# Patient Record
Sex: Male | Born: 1965 | Race: White | Hispanic: No | Marital: Single | Smoking: Never smoker
Health system: Southern US, Community
[De-identification: ages and names within clinical notes are randomized; demographics above are authoritative.]

## PROBLEM LIST (undated history)

## (undated) DIAGNOSIS — M92529 Juvenile osteochondrosis of tibia tubercle, unspecified leg: Secondary | ICD-10-CM

## (undated) DIAGNOSIS — F329 Major depressive disorder, single episode, unspecified: Secondary | ICD-10-CM

## (undated) DIAGNOSIS — T7840XA Allergy, unspecified, initial encounter: Secondary | ICD-10-CM

## (undated) DIAGNOSIS — F419 Anxiety disorder, unspecified: Secondary | ICD-10-CM

## (undated) DIAGNOSIS — M9250 Juvenile osteochondrosis of tibia and fibula, unspecified leg: Secondary | ICD-10-CM

## (undated) DIAGNOSIS — F32A Depression, unspecified: Secondary | ICD-10-CM

## (undated) DIAGNOSIS — N200 Calculus of kidney: Secondary | ICD-10-CM

## (undated) DIAGNOSIS — E785 Hyperlipidemia, unspecified: Secondary | ICD-10-CM

## (undated) HISTORY — DX: Depression, unspecified: F32.A

## (undated) HISTORY — DX: Juvenile osteochondrosis of tibia and fibula, unspecified leg: M92.50

## (undated) HISTORY — DX: Major depressive disorder, single episode, unspecified: F32.9

## (undated) HISTORY — DX: Allergy, unspecified, initial encounter: T78.40XA

## (undated) HISTORY — DX: Calculus of kidney: N20.0

## (undated) HISTORY — DX: Anxiety disorder, unspecified: F41.9

## (undated) HISTORY — DX: Hyperlipidemia, unspecified: E78.5

## (undated) HISTORY — DX: Juvenile osteochondrosis of tibia tubercle, unspecified leg: M92.529

---

## 2007-12-17 ENCOUNTER — Ambulatory Visit: Payer: Self-pay | Admitting: *Deleted

## 2007-12-26 ENCOUNTER — Ambulatory Visit: Payer: Self-pay | Admitting: *Deleted

## 2008-01-02 ENCOUNTER — Ambulatory Visit: Payer: Self-pay | Admitting: *Deleted

## 2008-01-14 ENCOUNTER — Ambulatory Visit: Payer: Self-pay | Admitting: *Deleted

## 2008-01-21 ENCOUNTER — Ambulatory Visit: Payer: Self-pay | Admitting: *Deleted

## 2008-01-28 ENCOUNTER — Ambulatory Visit: Payer: Self-pay | Admitting: *Deleted

## 2008-02-04 ENCOUNTER — Ambulatory Visit: Payer: Self-pay | Admitting: *Deleted

## 2008-02-11 ENCOUNTER — Ambulatory Visit: Payer: Self-pay | Admitting: *Deleted

## 2008-02-18 ENCOUNTER — Ambulatory Visit: Payer: Self-pay | Admitting: *Deleted

## 2008-02-25 ENCOUNTER — Ambulatory Visit: Payer: Self-pay | Admitting: *Deleted

## 2008-03-03 ENCOUNTER — Ambulatory Visit: Payer: Self-pay | Admitting: *Deleted

## 2008-03-10 ENCOUNTER — Ambulatory Visit: Payer: Self-pay | Admitting: *Deleted

## 2008-03-17 ENCOUNTER — Ambulatory Visit: Payer: Self-pay | Admitting: *Deleted

## 2008-03-24 ENCOUNTER — Ambulatory Visit: Payer: Self-pay | Admitting: *Deleted

## 2008-04-07 ENCOUNTER — Ambulatory Visit: Payer: Self-pay | Admitting: *Deleted

## 2008-04-15 ENCOUNTER — Ambulatory Visit: Payer: Self-pay | Admitting: *Deleted

## 2008-04-21 ENCOUNTER — Ambulatory Visit: Payer: Self-pay | Admitting: *Deleted

## 2008-05-05 ENCOUNTER — Ambulatory Visit: Payer: Self-pay | Admitting: *Deleted

## 2008-05-19 ENCOUNTER — Ambulatory Visit: Payer: Self-pay | Admitting: *Deleted

## 2008-06-02 ENCOUNTER — Ambulatory Visit: Payer: Self-pay | Admitting: *Deleted

## 2008-06-16 ENCOUNTER — Ambulatory Visit: Payer: Self-pay | Admitting: *Deleted

## 2008-06-30 ENCOUNTER — Ambulatory Visit: Payer: Self-pay | Admitting: *Deleted

## 2008-07-14 ENCOUNTER — Ambulatory Visit: Payer: Self-pay | Admitting: *Deleted

## 2008-07-28 ENCOUNTER — Ambulatory Visit: Payer: Self-pay | Admitting: *Deleted

## 2008-08-11 ENCOUNTER — Ambulatory Visit: Payer: Self-pay | Admitting: *Deleted

## 2009-04-29 ENCOUNTER — Ambulatory Visit: Payer: Self-pay | Admitting: *Deleted

## 2009-05-06 ENCOUNTER — Ambulatory Visit: Payer: Self-pay | Admitting: *Deleted

## 2014-07-06 ENCOUNTER — Telehealth: Payer: Self-pay | Admitting: Medical

## 2014-07-06 ENCOUNTER — Encounter: Payer: Self-pay | Admitting: Medical

## 2014-07-06 ENCOUNTER — Ambulatory Visit (HOSPITAL_BASED_OUTPATIENT_CLINIC_OR_DEPARTMENT_OTHER)
Admission: RE | Admit: 2014-07-06 | Discharge: 2014-07-06 | Disposition: A | Discharge status: Home / Self Care | Payer: BC Managed Care – PPO | Source: Ambulatory Visit | Attending: Medical | Admitting: Medical

## 2014-07-06 ENCOUNTER — Ambulatory Visit (INDEPENDENT_AMBULATORY_CARE_PROVIDER_SITE_OTHER): Payer: BC Managed Care – PPO | Admitting: Medical

## 2014-07-06 VITALS — BP 133/75 | HR 69 | Temp 98.0°F | Ht 69.0 in | Wt 178.0 lb

## 2014-07-06 DIAGNOSIS — E785 Hyperlipidemia, unspecified: Secondary | ICD-10-CM

## 2014-07-06 DIAGNOSIS — M25519 Pain in unspecified shoulder: Secondary | ICD-10-CM

## 2014-07-06 DIAGNOSIS — M25511 Pain in right shoulder: Secondary | ICD-10-CM

## 2014-07-06 DIAGNOSIS — M7650 Patellar tendinitis, unspecified knee: Secondary | ICD-10-CM

## 2014-07-06 DIAGNOSIS — M25569 Pain in unspecified knee: Secondary | ICD-10-CM | POA: Diagnosis not present

## 2014-07-06 DIAGNOSIS — M25562 Pain in left knee: Secondary | ICD-10-CM

## 2014-07-06 DIAGNOSIS — F4323 Adjustment disorder with mixed anxiety and depressed mood: Secondary | ICD-10-CM

## 2014-07-06 DIAGNOSIS — M7651 Patellar tendinitis, right knee: Secondary | ICD-10-CM

## 2014-07-06 DIAGNOSIS — M7652 Patellar tendinitis, left knee: Secondary | ICD-10-CM

## 2014-07-06 DIAGNOSIS — L989 Disorder of the skin and subcutaneous tissue, unspecified: Secondary | ICD-10-CM

## 2014-07-06 LAB — COMPREHENSIVE METABOLIC PANEL
ALT: 22 U/L (ref 0–53)
AST: 18 U/L (ref 0–37)
Albumin: 4.6 g/dL (ref 3.5–5.2)
Alkaline Phosphatase: 46 U/L (ref 39–117)
BUN: 14 mg/dL (ref 6–23)
CO2: 27 meq/L (ref 19–32)
CREATININE: 1 mg/dL (ref 0.4–1.5)
Calcium: 9.8 mg/dL (ref 8.4–10.5)
Chloride: 104 mEq/L (ref 96–112)
GFR: 89.95 mL/min (ref >60.00)
GLUCOSE: 97 mg/dL (ref 70–99)
Potassium: 4.6 mEq/L (ref 3.5–5.1)
Sodium: 137 mEq/L (ref 135–145)
TOTAL PROTEIN: 7.8 g/dL (ref 6.0–8.3)
Total Bilirubin: 0.7 mg/dL (ref 0.2–1.2)

## 2014-07-06 LAB — LIPID PANEL
Cholesterol: 254 mg/dL — ABNORMAL HIGH (ref 0–200)
HDL: 37.9 mg/dL — AB (ref >39.00)
NonHDL: 216.1
Total CHOL/HDL Ratio: 7
Triglycerides: 313 mg/dL — ABNORMAL HIGH (ref 0.0–149.0)
VLDL: 62.6 mg/dL — ABNORMAL HIGH (ref 0.0–40.0)

## 2014-07-06 LAB — LDL CHOLESTEROL, DIRECT: Direct LDL: 167.3 mg/dL

## 2014-07-06 NOTE — Assessment & Plan Note (Signed)
Stable and is treated by psychiatrist.

## 2014-07-06 NOTE — Assessment & Plan Note (Signed)
Pt lt knee is side that bothers him most. So will get xray today and assess joint.

## 2014-07-06 NOTE — Assessment & Plan Note (Signed)
Will check lipid panel today 

## 2014-07-06 NOTE — Progress Notes (Signed)
Pre visit review using our clinic review tool, if applicable. No additional management support is needed unless otherwise documented below in the visit note. 

## 2014-07-06 NOTE — Assessment & Plan Note (Addendum)
Xray of right shoulder. Good rom. Offered PT and discussed pt  but declined today. Will follow xray and see how he does. May refer to PT later.  Use ibuprofen presently for both shoulder and knee pain.

## 2014-07-06 NOTE — Patient Instructions (Addendum)
For your rt shoulder pain. I will get a xray. Continue with low dose ibuprofen. I offered referal to PT.   For your high cholesterol history wil get lipid panel and cmp today.   For our skin lesion will refer to dermatologist.   Lt your left knee. I want to do xrays today to assess appearance of tibial tuberosity.  Follow up in 4 wks or as needed.

## 2014-07-06 NOTE — Assessment & Plan Note (Signed)
Will go ahead and refer pt to dermatologist. Area on lt  bicep has been present for years. Mild irritation recently. He is concerned about area. His family has hx of melanoma and he is faired skin so will refer. Derm can evaluate and tx small wart as well.

## 2014-07-06 NOTE — Telephone Encounter (Signed)
Pt lipids reviewed and moderate high level lipids. He is 48 yr old with hx  Of hyperlipidemia in the past. Good response statin in the past. Rx of  simvistatin 20 mg po q day. 3 refills. Repeat lipid panel with cmp in 3 months fasting. His xray of knee and shoulder negative.

## 2014-07-06 NOTE — Progress Notes (Signed)
   Subjective:    Patient ID: Brian Rosales, male    DOB: December 25, 1965, 48 y.o.   MRN: [id number]  HPI  No prior pcp other than physical 10 yrs ago.   See psych. He is on Wellbutrin. Also on xanax .   Qid. Sees psych every 2 months. He is stable presently.  Pt does get pretibial knee pain. (Jumpers knee per pt.) Lt knee arthroscopic surgery as well. Stable mild pain. Lt slightly worse. Pain for years. Mild now.  Rt shoulder pain for 2 years. Pt grabbed child and shoulder popped. He tried to stop 48 yr old from falling. Over past year he ha worse pain. ROM decreased some.  10 yrs ago did have high cholesterol. Brief use stating decreased levels quick.   Lt upper bicep area reddish/pink lesion. There for years. No change in size. Mild redder. Feels dry and flaky. No bleeding. Small red dots occaional.  Pt unemployed. Primary school teacher. Masters degree. Exercise walks 3 times a week 3 miles. Divorced one child.     Review of Systems  Constitutional: Negative for fever, chills and fatigue.  HENT: Negative.   Respiratory: Negative for cough, chest tightness and wheezing.   Cardiovascular: Negative for chest pain and palpitations.  Endocrine: Negative for polydipsia, polyphagia and polyuria.  Genitourinary: Negative.   Musculoskeletal:       Rt shoulder pain. Bilateral knee pain over tibial proximal edge. Just below patella. Tuberosity region.  Skin:       Lt antecubital region. Small wart vs skin tag.   Lt upper bicep area- 8mm pinkish red lesion. Mild flaky appearance.  Neurological: Negative.   Hematological: Negative for adenopathy. Does not bruise/bleed easily.  Psychiatric/Behavioral: Negative for suicidal ideas, hallucinations, confusion, sleep disturbance, self-injury, dysphoric mood, decreased concentration and agitation. The patient is not nervous/anxious.        Seems little nervous talking with him.       Objective:   Physical Exam  Constitutional: He appears  well-developed and well-nourished. No distress.  HENT:  Head: Normocephalic and atraumatic.  Eyes: Conjunctivae and EOM are normal. Pupils are equal, round, and reactive to light.  Neck: Normal range of motion. Neck supple. No JVD present. No tracheal deviation present. No thyromegaly present.  Cardiovascular: Normal rate, regular rhythm and normal heart sounds.   Pulmonary/Chest: Effort normal and breath sounds normal. No stridor. No respiratory distress. He has no wheezes. He has no rales. He exhibits no tenderness.  Abdominal: Soft. Bowel sounds are normal. He exhibits no distension and no mass. There is no tenderness. There is no rebound and no guarding.  Musculoskeletal:  Rt shoulder- good rom.(abducts well) No crepitus. But reports some mild pain lateral aspect just below distal clavicle on rom.  Bilateral knees- prominent tibial tuberosities. No instablity, no crepitus. No effusions.  Lymphadenopathy:    He has no cervical adenopathy.  Skin: He is not diaphoretic.  Lt antecubital region- small wart vs skin tag.  Lt shoulder- proximal bicep had 8mm reddish, pink lesion. Slight flaky appearance.     Lt elbow wart. Lt upper proximal bicep red area 10 mm. Rt shouler good rom.   Lt knee- prominent tibial tuberosity.        Assessment & Plan:

## 2014-07-07 ENCOUNTER — Other Ambulatory Visit: Payer: Self-pay

## 2014-07-07 MED ORDER — SIMVASTATIN 20 MG PO TABS: 20.0000 mg | ORAL_TABLET | Freq: Every day | ORAL | Status: DC

## 2014-07-07 NOTE — Telephone Encounter (Signed)
I will call pt. But want to know what is her request or question first?  Thanks.

## 2014-07-07 NOTE — Telephone Encounter (Signed)
Patient called back given results. Simvastatin  sent to pharmacy.

## 2014-07-07 NOTE — Telephone Encounter (Signed)
Left message for patient to return call regarding Lab results.

## 2014-08-03 ENCOUNTER — Encounter: Payer: Self-pay | Admitting: Medical

## 2014-08-03 ENCOUNTER — Ambulatory Visit (INDEPENDENT_AMBULATORY_CARE_PROVIDER_SITE_OTHER): Payer: BC Managed Care – PPO | Admitting: Medical

## 2014-08-03 ENCOUNTER — Telehealth: Payer: Self-pay | Admitting: *Deleted

## 2014-08-03 VITALS — BP 129/85 | HR 70 | Temp 99.0°F | Ht 69.5 in | Wt 177.4 lb

## 2014-08-03 DIAGNOSIS — G8929 Other chronic pain: Secondary | ICD-10-CM

## 2014-08-03 DIAGNOSIS — E785 Hyperlipidemia, unspecified: Secondary | ICD-10-CM

## 2014-08-03 DIAGNOSIS — M25511 Pain in right shoulder: Secondary | ICD-10-CM

## 2014-08-03 DIAGNOSIS — M25562 Pain in left knee: Secondary | ICD-10-CM

## 2014-08-03 NOTE — Patient Instructions (Signed)
We will refer you to ortho to have rt shoulder evaluated for rotator cuff injury.  For your lt knee, I will also refer to ortho and they may recommend PT. You may have had the below dx.  Osgood-Schlatter Disease Osgood-Schlatter disease is a condition that is common in adolescents. It is most often seen during the time of growth spurts. During these times the muscles and cord-like structures that attach muscle to bone (tendons) are becoming tighter as the bones are becoming longer. This puts more strain on areas of tendon attachment. The condition is soreness (inflammation) of the lump on the upper leg below the kneecap (tibial tubercle). There is pain and tenderness in this area because of the inflammation. In addition to growth spurts, it also comes on with physical activities involving running and jumping. This is a self-limited condition. It can get well by itself in time with conservative measures and less physical activities. It can persist up to two years. DIAGNOSIS  The diagnosis is made by physical examination alone. X-rays are sometimes needed to rule out other problems. HOME CARE INSTRUCTIONS   Apply ice packs to the areas of pain 03-04 times a day for 15-20 minutes while awake. Do this for 2 days.  Limit physical activities to levels that do not cause pain.  Do stretching exercises for the legs and especially the large muscles in the front of the thigh (quadriceps). Avoid quadriceps strengthening exercises.  Only take over-the-counter or prescription medicines for pain, discomfort, or fever as directed by your caregiver.  Usually steroid injection or surgery is not necessary. Surgery is rarely needed if the condition persists into young adulthood.  See your caregiver if you develop increased pain or swelling in the area, if you have pain with movement of the knee, develop a temperature, or have more pain or problems that originally brought you in for care. Recheck with the hospital  or clinic if x-rays were taken. After a radiologist (a specialist in reading x-rays) has read your x-rays, make sure there is agreement with the initial readings. Find out if more studies are needed. Ask your caregiver how you are to learn about your radiology (x-ray) results. Remember it is your responsibility to obtain the results of your x-rays. MAKE SURE YOU:   Understand these instructions.  Will watch your condition.  Will get help right away if you are not doing well or get worse. Document Released: 09/28/2000 Document Revised: 12/24/2011 Document Reviewed: 09/27/2008 [facility] Patient Information 2015 [place], [place]. This information is not intended to replace advice given to you by your health care provider. Make sure you discuss any questions you have with your health care provider.  Future lipid panel and cmp to be done December 23rd or around.  Appointment date to be determined after lab or as needed.

## 2014-08-03 NOTE — Progress Notes (Signed)
Pre visit review using our clinic review tool, if applicable. No additional management support is needed unless otherwise documented below in the visit note. 

## 2014-08-03 NOTE — Assessment & Plan Note (Signed)
Continue otc motrin. Will refer to ortho for evaluation of rototor cuff due to chronic nature of condition and not improving.

## 2014-08-03 NOTE — Progress Notes (Signed)
Subjective:    Patient ID: Brian RohrerBlake Lewelling, male    DOB: 07/28/1966, 48 y.o.   MRN: [id number]  HPI  Rt shoulder pain on last visit. Pain is still present. No finding on his xray. Pain is still present on rom. Pt states 2 yrs ago had pop grabbing his child. Since then has hurt. Pt is taking ibuprofen otc for the pain.  Pt sent to dermatologist(For left upper ext skin lesion). Pt diagnosed with basal cell carcinoma. Dermatologist has treated and he is following up with dermatologist.  Pt left knee still hurts. No significant findings on left knee xray. Pain for 10 yrs or more. Knee is pain is constant and low level.  Pt is on simvastatin for his lipids. No adverse side effects reported.  Past Medical History  Diagnosis Date  . Anxiety   . Osgood-Schlatter's disease     By history and description. Probable.  . Depression   . Hyperlipidemia   . Kidney stones   . Allergy     History   Social History  . Marital Status: Single    Spouse Name: N/A    Number of Children: N/A  . Years of Education: N/A   Occupational History  . Not on file.   Social History Main Topics  . Smoking status: Never Smoker   . Smokeless tobacco: Never Used  . Alcohol Use: 2.0 oz/week    4 drink(s) per week  . Drug Use: No  . Sexual Activity: No   Other Topics Concern  . Not on file   Social History Narrative  . No narrative on file    No past surgical history on file.  Family History  Problem Relation Age of Onset  . Glaucoma Mother   . Basal cell carcinoma Mother   . Hyperlipidemia Father   . Prostate cancer Father   . Melanoma Paternal Grandmother     No Known Allergies  Current Outpatient Prescriptions on File Prior to Visit  Medication Sig Dispense Refill  . fluticasone (FLONASE) 50 MCG/ACT nasal spray Place into both nostrils daily.      Marland Kitchen. loratadine (CLARITIN) 10 MG tablet Take 10 mg by mouth daily.      . simvastatin (ZOCOR) 20 MG tablet Take 1 tablet (20 mg total) by  mouth daily.  90 tablet  3   No current facility-administered medications on file prior to visit.    BP 129/85  Pulse 70  Temp(Src) 99 F (37.2 C) (Oral)  Ht 5' 9.5" (1.765 m)  Wt 177 lb 6.4 oz (80.468 kg)  BMI 25.83 kg/m2  SpO2 98%      Review of Systems  Constitutional: Negative for fever, chills and fatigue.  HENT: Negative.   Respiratory: Negative for cough, choking, chest tightness, shortness of breath and wheezing.   Cardiovascular: Negative for chest pain and palpitations.  Gastrointestinal: Negative.   Genitourinary: Negative.   Musculoskeletal:       Rt shoulder and left knee pain.  Skin:       Area left upper extremity treated by derm.  Neurological: Negative.   Hematological: Negative.   Psychiatric/Behavioral: Negative.        Objective:   Physical Exam  General- no acute distress. Lungs-clear, even, and unlabored. Heart -RRR Rt shoulder- from but pain on rom. Some on abduction. No crepitus. Lt knee- from, no instablility. Prominent tibial tuberosity. Skin- Lt upper extremity shows healing area where dermatologist tx basal cell carcinoma. Rt shoulder- limited rom the same.  Lt knee- same as before.       Assessment & Plan:  December 23rd. Lipid panel fasting. And cmp.

## 2014-08-03 NOTE — Assessment & Plan Note (Signed)
Will refer to ortho as well since this is chronic issue as well.

## 2014-08-03 NOTE — Assessment & Plan Note (Signed)
Lipid panel December 23rd or later. Order to be placed.

## 2014-08-11 NOTE — Telephone Encounter (Signed)
error 

## 2014-08-30 ENCOUNTER — Ambulatory Visit (INDEPENDENT_AMBULATORY_CARE_PROVIDER_SITE_OTHER): Payer: BC Managed Care – PPO | Admitting: Physical Therapy

## 2014-08-30 DIAGNOSIS — M7042 Prepatellar bursitis, left knee: Secondary | ICD-10-CM

## 2014-08-30 DIAGNOSIS — M7041 Prepatellar bursitis, right knee: Secondary | ICD-10-CM

## 2014-08-30 DIAGNOSIS — R5381 Other malaise: Secondary | ICD-10-CM

## 2014-08-30 DIAGNOSIS — M2560 Stiffness of unspecified joint, not elsewhere classified: Secondary | ICD-10-CM

## 2014-08-30 DIAGNOSIS — M25569 Pain in unspecified knee: Secondary | ICD-10-CM

## 2014-08-31 ENCOUNTER — Ambulatory Visit: Payer: BC Managed Care – PPO

## 2014-09-03 ENCOUNTER — Encounter (INDEPENDENT_AMBULATORY_CARE_PROVIDER_SITE_OTHER): Payer: BC Managed Care – PPO | Admitting: Physical Therapy

## 2014-09-03 DIAGNOSIS — M7041 Prepatellar bursitis, right knee: Secondary | ICD-10-CM

## 2014-09-03 DIAGNOSIS — M2560 Stiffness of unspecified joint, not elsewhere classified: Secondary | ICD-10-CM

## 2014-09-03 DIAGNOSIS — R5381 Other malaise: Secondary | ICD-10-CM

## 2014-09-03 DIAGNOSIS — M7042 Prepatellar bursitis, left knee: Secondary | ICD-10-CM

## 2014-09-03 DIAGNOSIS — M25569 Pain in unspecified knee: Secondary | ICD-10-CM

## 2014-09-06 ENCOUNTER — Encounter (INDEPENDENT_AMBULATORY_CARE_PROVIDER_SITE_OTHER): Payer: BC Managed Care – PPO | Admitting: Physical Therapy

## 2014-09-06 DIAGNOSIS — M25569 Pain in unspecified knee: Secondary | ICD-10-CM

## 2014-09-06 DIAGNOSIS — M7042 Prepatellar bursitis, left knee: Secondary | ICD-10-CM

## 2014-09-06 DIAGNOSIS — M7041 Prepatellar bursitis, right knee: Secondary | ICD-10-CM

## 2014-09-06 DIAGNOSIS — R5381 Other malaise: Secondary | ICD-10-CM

## 2014-09-06 DIAGNOSIS — M2560 Stiffness of unspecified joint, not elsewhere classified: Secondary | ICD-10-CM

## 2014-09-08 ENCOUNTER — Encounter: Payer: BC Managed Care – PPO | Admitting: Physical Therapy

## 2014-09-14 ENCOUNTER — Encounter (INDEPENDENT_AMBULATORY_CARE_PROVIDER_SITE_OTHER): Payer: BC Managed Care – PPO | Admitting: Physical Therapy

## 2014-09-14 DIAGNOSIS — R5381 Other malaise: Secondary | ICD-10-CM

## 2014-09-14 DIAGNOSIS — M7042 Prepatellar bursitis, left knee: Secondary | ICD-10-CM

## 2014-09-14 DIAGNOSIS — M25569 Pain in unspecified knee: Secondary | ICD-10-CM

## 2014-09-14 DIAGNOSIS — M2560 Stiffness of unspecified joint, not elsewhere classified: Secondary | ICD-10-CM

## 2014-09-14 DIAGNOSIS — M7041 Prepatellar bursitis, right knee: Secondary | ICD-10-CM

## 2014-09-17 ENCOUNTER — Encounter (INDEPENDENT_AMBULATORY_CARE_PROVIDER_SITE_OTHER): Payer: BC Managed Care – PPO | Admitting: Physical Therapy

## 2014-09-17 DIAGNOSIS — M25569 Pain in unspecified knee: Secondary | ICD-10-CM

## 2014-09-17 DIAGNOSIS — M7042 Prepatellar bursitis, left knee: Secondary | ICD-10-CM

## 2014-09-17 DIAGNOSIS — M7041 Prepatellar bursitis, right knee: Secondary | ICD-10-CM

## 2014-09-17 DIAGNOSIS — M2560 Stiffness of unspecified joint, not elsewhere classified: Secondary | ICD-10-CM

## 2014-09-17 DIAGNOSIS — R5381 Other malaise: Secondary | ICD-10-CM

## 2014-09-22 ENCOUNTER — Encounter (INDEPENDENT_AMBULATORY_CARE_PROVIDER_SITE_OTHER): Payer: BC Managed Care – PPO | Admitting: Physical Therapy

## 2014-09-22 DIAGNOSIS — R5381 Other malaise: Secondary | ICD-10-CM

## 2014-09-22 DIAGNOSIS — M25569 Pain in unspecified knee: Secondary | ICD-10-CM

## 2014-09-22 DIAGNOSIS — M2560 Stiffness of unspecified joint, not elsewhere classified: Secondary | ICD-10-CM

## 2014-09-22 DIAGNOSIS — M7042 Prepatellar bursitis, left knee: Secondary | ICD-10-CM

## 2014-09-22 DIAGNOSIS — M7041 Prepatellar bursitis, right knee: Secondary | ICD-10-CM

## 2014-09-24 ENCOUNTER — Encounter (INDEPENDENT_AMBULATORY_CARE_PROVIDER_SITE_OTHER): Payer: BC Managed Care – PPO | Admitting: Physical Therapy

## 2014-09-24 DIAGNOSIS — M7041 Prepatellar bursitis, right knee: Secondary | ICD-10-CM

## 2014-09-24 DIAGNOSIS — M7042 Prepatellar bursitis, left knee: Secondary | ICD-10-CM

## 2014-09-24 DIAGNOSIS — M2560 Stiffness of unspecified joint, not elsewhere classified: Secondary | ICD-10-CM

## 2014-09-24 DIAGNOSIS — M25569 Pain in unspecified knee: Secondary | ICD-10-CM

## 2014-09-24 DIAGNOSIS — R5381 Other malaise: Secondary | ICD-10-CM

## 2015-11-21 IMAGING — CR DG KNEE 1-2V*L*
2 series · 2 of 2 positions shown · non-contrast
Comparison: None.

CLINICAL DATA: Anterior and posterior shoulder pain, anterior knee
pain

EXAM:
LEFT KNEE - 1-2 VIEW

[t knee ap left]
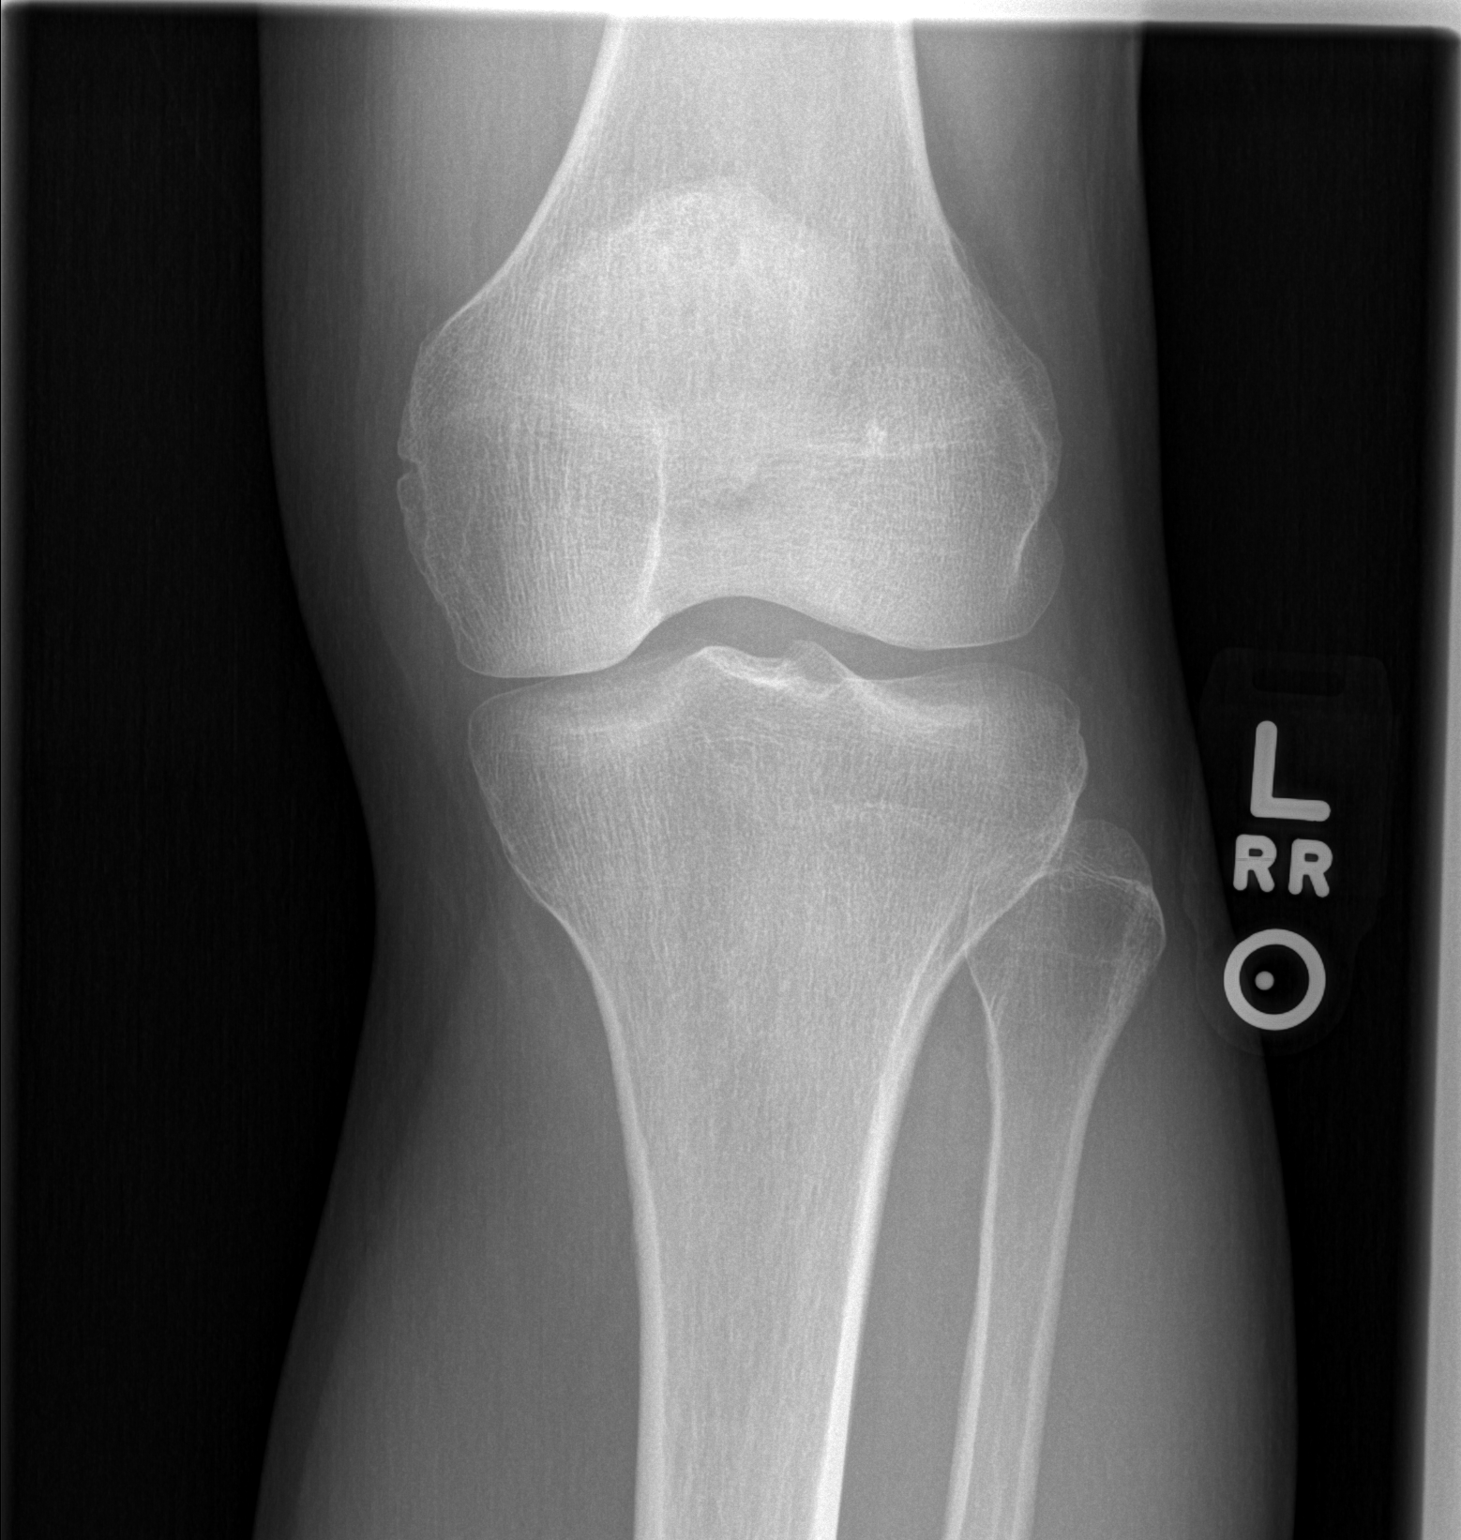

[t knee lat left]
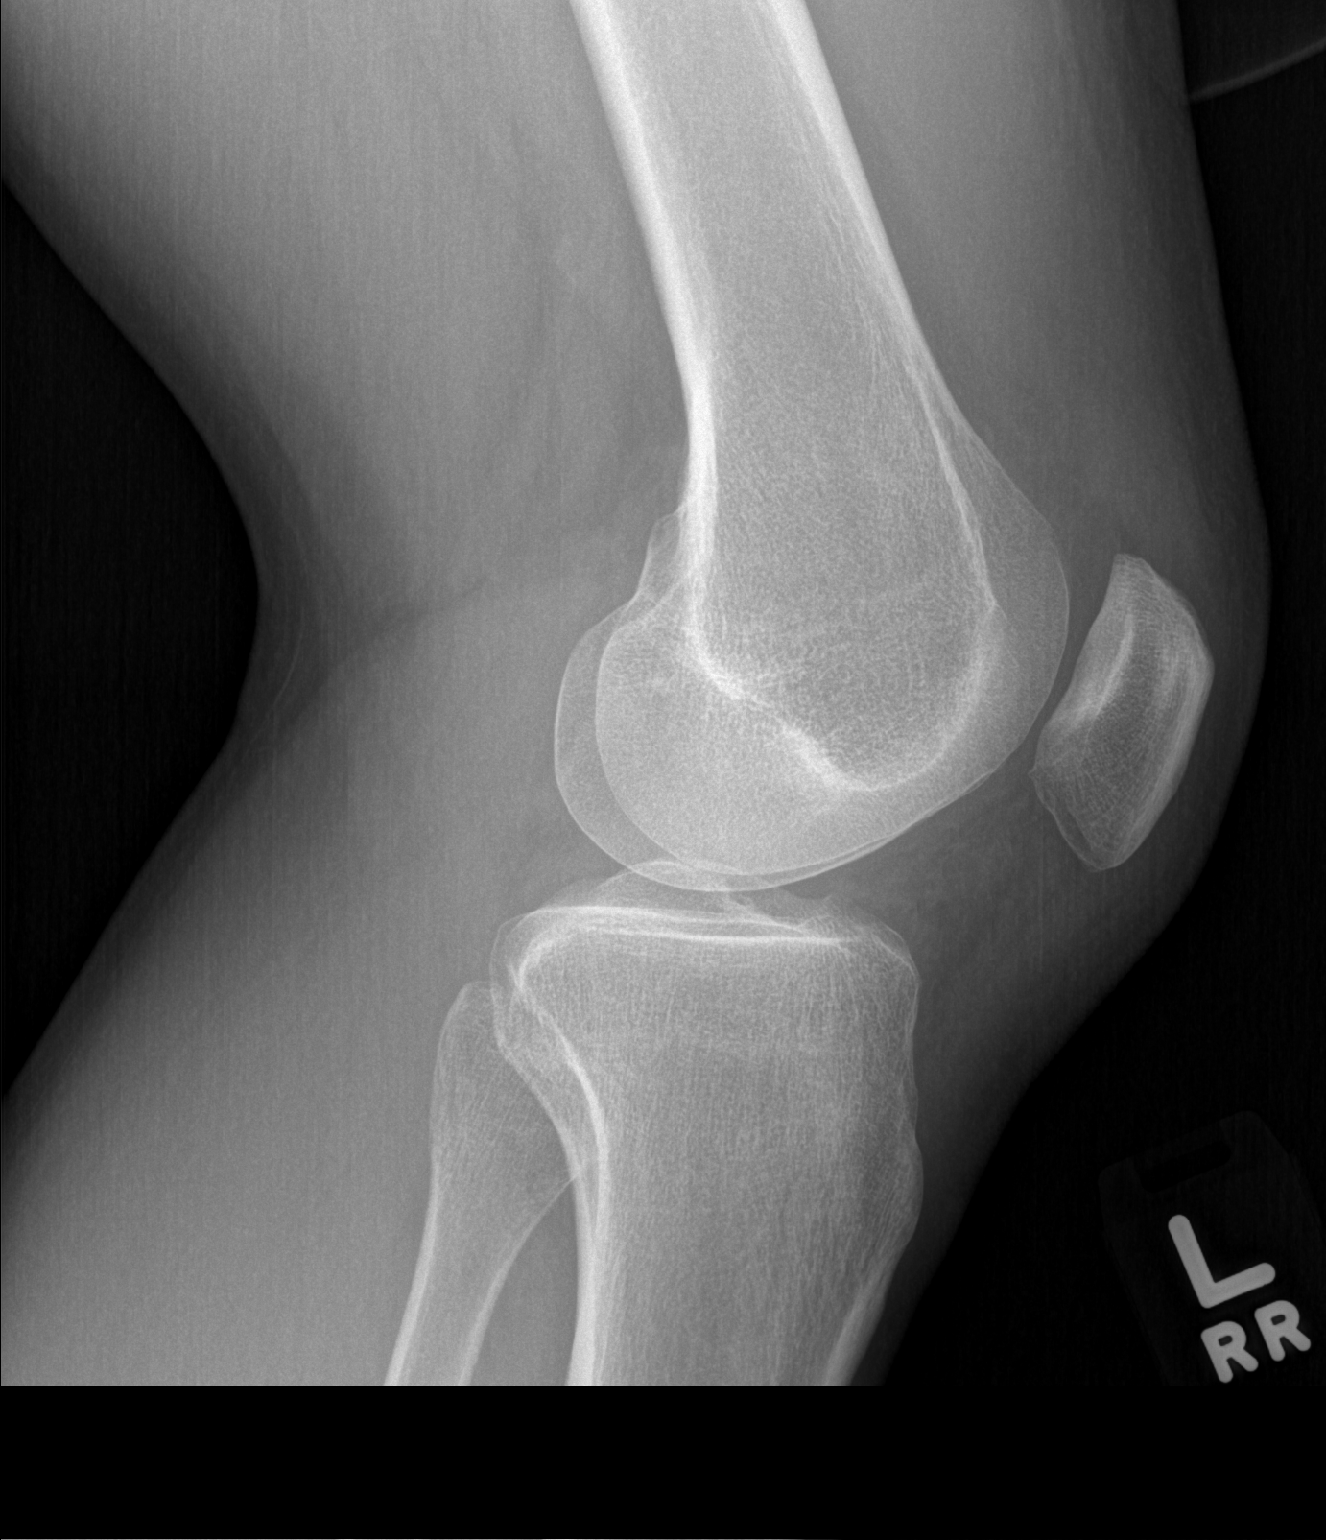

[2 of 2 positions shown; findings below may reference images not displayed]

FINDINGS: There is no evidence of fracture, dislocation, or joint effusion.
There is no evidence of arthropathy or other focal bone abnormality.
Soft tissues are unremarkable.
IMPRESSION: Negative.

## 2015-12-15 ENCOUNTER — Encounter: Payer: Self-pay | Admitting: Medical

## 2015-12-15 ENCOUNTER — Telehealth: Payer: Self-pay | Admitting: Medical

## 2015-12-15 ENCOUNTER — Ambulatory Visit (INDEPENDENT_AMBULATORY_CARE_PROVIDER_SITE_OTHER): Payer: BLUE CROSS/BLUE SHIELD | Admitting: Medical

## 2015-12-15 VITALS — BP 120/86 | HR 78 | Temp 98.1°F | Ht 69.5 in | Wt 182.4 lb

## 2015-12-15 DIAGNOSIS — R7989 Other specified abnormal findings of blood chemistry: Secondary | ICD-10-CM

## 2015-12-15 DIAGNOSIS — Z0000 Encounter for general adult medical examination without abnormal findings: Secondary | ICD-10-CM | POA: Insufficient documentation

## 2015-12-15 DIAGNOSIS — Z113 Encounter for screening for infections with a predominantly sexual mode of transmission: Secondary | ICD-10-CM | POA: Diagnosis not present

## 2015-12-15 LAB — COMPREHENSIVE METABOLIC PANEL
ALBUMIN: 4.5 g/dL (ref 3.5–5.2)
ALT: 22 U/L (ref 0–53)
AST: 17 U/L (ref 0–37)
Alkaline Phosphatase: 47 U/L (ref 39–117)
BILIRUBIN TOTAL: 0.6 mg/dL (ref 0.2–1.2)
BUN: 12 mg/dL (ref 6–23)
CALCIUM: 9.6 mg/dL (ref 8.4–10.5)
CO2: 29 mEq/L (ref 19–32)
CREATININE: 1 mg/dL (ref 0.40–1.50)
Chloride: 105 mEq/L (ref 96–112)
GFR: 84.27 mL/min (ref >60.00)
Glucose, Bld: 97 mg/dL (ref 70–99)
Potassium: 4.1 mEq/L (ref 3.5–5.1)
Sodium: 138 mEq/L (ref 135–145)
Total Protein: 7 g/dL (ref 6.0–8.3)

## 2015-12-15 LAB — POC URINALSYSI DIPSTICK (AUTOMATED)
Bilirubin, UA: NEGATIVE
Glucose, UA: NEGATIVE
KETONES UA: NEGATIVE
Leukocytes, UA: NEGATIVE
Nitrite, UA: NEGATIVE
PH UA: 6.5
PROTEIN UA: NEGATIVE
RBC UA: NEGATIVE
Urobilinogen, UA: 0.2

## 2015-12-15 LAB — CBC WITH DIFFERENTIAL/PLATELET
BASOS PCT: 0.5 % (ref 0.0–3.0)
Basophils Absolute: 0 10*3/uL (ref 0.0–0.1)
EOS PCT: 1.9 % (ref 0.0–5.0)
Eosinophils Absolute: 0.1 10*3/uL (ref 0.0–0.7)
HEMATOCRIT: 44.5 % (ref 39.0–52.0)
HEMOGLOBIN: 15.4 g/dL (ref 13.0–17.0)
Lymphocytes Relative: 34.4 % (ref 12.0–46.0)
Lymphs Abs: 1.7 10*3/uL (ref 0.7–4.0)
MCHC: 34.6 g/dL (ref 30.0–36.0)
MCV: 88.9 fl (ref 78.0–100.0)
MONO ABS: 0.4 10*3/uL (ref 0.1–1.0)
Monocytes Relative: 7.8 % (ref 3.0–12.0)
Neutro Abs: 2.8 10*3/uL (ref 1.4–7.7)
Neutrophils Relative %: 55.4 % (ref 43.0–77.0)
Platelets: 237 10*3/uL (ref 150.0–400.0)
RBC: 5.01 Mil/uL (ref 4.22–5.81)
RDW: 13.3 % (ref 11.5–15.5)
WBC: 5 10*3/uL (ref 4.0–10.5)

## 2015-12-15 LAB — TSH: TSH: 1.95 u[IU]/mL (ref 0.35–4.50)

## 2015-12-15 LAB — LIPID PANEL
CHOL/HDL RATIO: 7
Cholesterol: 247 mg/dL — ABNORMAL HIGH (ref 0–200)
HDL: 36.4 mg/dL — ABNORMAL LOW (ref >39.00)
NONHDL: 210.34
Triglycerides: 288 mg/dL — ABNORMAL HIGH (ref 0.0–149.0)
VLDL: 57.6 mg/dL — AB (ref 0.0–40.0)

## 2015-12-15 LAB — LDL CHOLESTEROL, DIRECT: Direct LDL: 137 mg/dL

## 2015-12-15 LAB — HIV ANTIBODY (ROUTINE TESTING W REFLEX): HIV: NONREACTIVE

## 2015-12-15 MED ORDER — SIMVASTATIN 20 MG PO TABS: 20.0000 mg | ORAL_TABLET | Freq: Every day | ORAL | Status: AC

## 2015-12-15 NOTE — Telephone Encounter (Signed)
rx sent to his pharmacy 

## 2015-12-15 NOTE — Telephone Encounter (Signed)
Did you run pt urine. Also when he is in for follow up in 2 weeks would you offer him tdap and flu vaccine.

## 2015-12-15 NOTE — Patient Instructions (Addendum)
Wellness examination Cbc, cmp, tsh, lipid panel and ua today. Hiv screening as well.   Regarding your blood pressure reading and pulse. Check 3 times a week. Follow up in 2 wks. Review bp and pulse. See if we need to put you on beta blocker. Will also get ekg on that date. Schedule early am appointment.  Also recommend sometime next year to get screening psa.  Follow up in 2 wks or as needed  Pt will be back in 2 wks will get MA to call pt and offer tdap and flu vaccine.   Preventive Care for Adults, Male A healthy lifestyle and preventive care can promote health and wellness. Preventive health guidelines for men include the following key practices:  A routine yearly physical is a good way to check with your health care provider about your health and preventative screening. It is a chance to share any concerns and updates on your health and to receive a thorough exam.  Visit your dentist for a routine exam and preventative care every 6 months. Brush your teeth twice a day and floss once a day. Good oral hygiene prevents tooth decay and gum disease.  The frequency of eye exams is based on your age, health, family medical history, use of contact lenses, and other factors. Follow your health care provider's recommendations for frequency of eye exams.  Eat a healthy diet. Foods such as vegetables, fruits, whole grains, low-fat dairy products, and lean protein foods contain the nutrients you need without too many calories. Decrease your intake of foods high in solid fats, added sugars, and salt. Eat the right amount of calories for you.Get information about a proper diet from your health care provider, if necessary.  Regular physical exercise is one of the most important things you can do for your health. Most adults should get at least 150 minutes of moderate-intensity exercise (any activity that increases your heart rate and causes you to sweat) each week. In addition, most adults need  muscle-strengthening exercises on 2 or more days a week.  Maintain a healthy weight. The body mass index (BMI) is a screening tool to identify possible weight problems. It provides an estimate of body fat based on height and weight. Your health care provider can find your BMI and can help you achieve or maintain a healthy weight.For adults 20 years and older:  A BMI below 18.5 is considered underweight.  A BMI of 18.5 to 24.9 is normal.  A BMI of 25 to 29.9 is considered overweight.  A BMI of 30 and above is considered obese.  Maintain normal blood lipids and cholesterol levels by exercising and minimizing your intake of saturated fat. Eat a balanced diet with plenty of fruit and vegetables. Blood tests for lipids and cholesterol should begin at age 43 and be repeated every 5 years. If your lipid or cholesterol levels are high, you are over 50, or you are at high risk for heart disease, you may need your cholesterol levels checked more frequently.Ongoing high lipid and cholesterol levels should be treated with medicines if diet and exercise are not working.  If you smoke, find out from your health care provider how to quit. If you do not use tobacco, do not start.  Lung cancer screening is recommended for adults aged 46-80 years who are at high risk for developing lung cancer because of a history of smoking. A yearly low-dose CT scan of the lungs is recommended for people who have at least a 30-pack-year  history of smoking and are a current smoker or have quit within the past 15 years. A pack year of smoking is smoking an average of 1 pack of cigarettes a day for 1 year (for example: 1 pack a day for 30 years or 2 packs a day for 15 years). Yearly screening should continue until the smoker has stopped smoking for at least 15 years. Yearly screening should be stopped for people who develop a health problem that would prevent them from having lung cancer treatment.  If you choose to drink alcohol,  do not have more than 2 drinks per day. One drink is considered to be 12 ounces (355 mL) of beer, 5 ounces (148 mL) of wine, or 1.5 ounces (44 mL) of liquor.  Avoid use of street drugs. Do not share needles with anyone. Ask for help if you need support or instructions about stopping the use of drugs.  High blood pressure causes heart disease and increases the risk of stroke. Your blood pressure should be checked at least every 1-2 years. Ongoing high blood pressure should be treated with medicines, if weight loss and exercise are not effective.  If you are 70-78 years old, ask your health care provider if you should take aspirin to prevent heart disease.  Diabetes screening is done by taking a blood sample to check your blood glucose level after you have not eaten for a certain period of time (fasting). If you are not overweight and you do not have risk factors for diabetes, you should be screened once every 3 years starting at age 63. If you are overweight or obese and you are 3-26 years of age, you should be screened for diabetes every year as part of your cardiovascular risk assessment.  Colorectal cancer can be detected and often prevented. Most routine colorectal cancer screening begins at the age of 52 and continues through age 46. However, your health care provider may recommend screening at an earlier age if you have risk factors for colon cancer. On a yearly basis, your health care provider may provide home test kits to check for hidden blood in the stool. Use of a small camera at the end of a tube to directly examine the colon (sigmoidoscopy or colonoscopy) can detect the earliest forms of colorectal cancer. Talk to your health care provider about this at age 36, when routine screening begins. Direct exam of the colon should be repeated every 5-10 years through age 56, unless early forms of precancerous polyps or small growths are found.  People who are at an increased risk for hepatitis B  should be screened for this virus. You are considered at high risk for hepatitis B if:  You were born in a country where hepatitis B occurs often. Talk with your health care provider about which countries are considered high risk.  Your parents were born in a high-risk country and you have not received a shot to protect against hepatitis B (hepatitis B vaccine).  You have HIV or AIDS.  You use needles to inject street drugs.  You live with, or have sex with, someone who has hepatitis B.  You are a man who has sex with other men (MSM).  You get hemodialysis treatment.  You take certain medicines for conditions such as cancer, organ transplantation, and autoimmune conditions.  Hepatitis C blood testing is recommended for all people born from [birth year] through 1965 and any individual with known risks for hepatitis C.  Practice safe sex. Use condoms  and avoid high-risk sexual practices to reduce the spread of sexually transmitted infections (STIs). STIs include gonorrhea, chlamydia, syphilis, trichomonas, herpes, HPV, and human immunodeficiency virus (HIV). Herpes, HIV, and HPV are viral illnesses that have no cure. They can result in disability, cancer, and death.  If you are a man who has sex with other men, you should be screened at least once per year for:  HIV.  Urethral, rectal, and pharyngeal infection of gonorrhea, chlamydia, or both.  If you are at risk of being infected with HIV, it is recommended that you take a prescription medicine daily to prevent HIV infection. This is called preexposure prophylaxis (PrEP). You are considered at risk if:  You are a man who has sex with other men (MSM) and have other risk factors.  You are a heterosexual man, are sexually active, and are at increased risk for HIV infection.  You take drugs by injection.  You are sexually active with a partner who has HIV.  Talk with your health care provider about whether you are at high risk of being  infected with HIV. If you choose to begin PrEP, you should first be tested for HIV. You should then be tested every 3 months for as long as you are taking PrEP.  A one-time screening for abdominal aortic aneurysm (AAA) and surgical repair of large AAAs by ultrasound are recommended for men ages 105 to 58 years who are current or former smokers.  Healthy men should no longer receive prostate-specific antigen (PSA) blood tests as part of routine cancer screening. Talk with your health care provider about prostate cancer screening.  Testicular cancer screening is not recommended for adult males who have no symptoms. Screening includes self-exam, a health care provider exam, and other screening tests. Consult with your health care provider about any symptoms you have or any concerns you have about testicular cancer.  Use sunscreen. Apply sunscreen liberally and repeatedly throughout the day. You should seek shade when your shadow is shorter than you. Protect yourself by wearing long sleeves, pants, a wide-brimmed hat, and sunglasses year round, whenever you are outdoors.  Once a month, do a whole-body skin exam, using a mirror to look at the skin on your back. Tell your health care provider about new moles, moles that have irregular borders, moles that are larger than a pencil eraser, or moles that have changed in shape or color.  Stay current with required vaccines (immunizations).  Influenza vaccine. All adults should be immunized every year.  Tetanus, diphtheria, and acellular pertussis (Td, Tdap) vaccine. An adult who has not previously received Tdap or who does not know his vaccine status should receive 1 dose of Tdap. This initial dose should be followed by tetanus and diphtheria toxoids (Td) booster doses every 10 years. Adults with an unknown or incomplete history of completing a 3-dose immunization series with Td-containing vaccines should begin or complete a primary immunization series including  a Tdap dose. Adults should receive a Td booster every 10 years.  Varicella vaccine. An adult without evidence of immunity to varicella should receive 2 doses or a second dose if he has previously received 1 dose.  Human papillomavirus (HPV) vaccine. Males aged 11-21 years who have not received the vaccine previously should receive the 3-dose series. Males aged 22-26 years may be immunized. Immunization is recommended through the age of 19 years for any male who has sex with males and did not get any or all doses earlier. Immunization is recommended for  any person with an immunocompromised condition through the age of 54 years if he did not get any or all doses earlier. During the 3-dose series, the second dose should be obtained 4-8 weeks after the first dose. The third dose should be obtained 24 weeks after the first dose and 16 weeks after the second dose.  Zoster vaccine. One dose is recommended for adults aged 36 years or older unless certain conditions are present.  Measles, mumps, and rubella (MMR) vaccine. Adults born before 75 generally are considered immune to measles and mumps. Adults born in [birth year] or later should have 1 or more doses of MMR vaccine unless there is a contraindication to the vaccine or there is laboratory evidence of immunity to each of the three diseases. A routine second dose of MMR vaccine should be obtained at least 28 days after the first dose for students attending postsecondary schools, health care workers, or international travelers. People who received inactivated measles vaccine or an unknown type of measles vaccine during 1963-1967 should receive 2 doses of MMR vaccine. People who received inactivated mumps vaccine or an unknown type of mumps vaccine before 1979 and are at high risk for mumps infection should consider immunization with 2 doses of MMR vaccine. Unvaccinated health care workers born before [birth year] who lack laboratory evidence of measles, mumps, or rubella  immunity or laboratory confirmation of disease should consider measles and mumps immunization with 2 doses of MMR vaccine or rubella immunization with 1 dose of MMR vaccine.  Pneumococcal 13-valent conjugate (PCV13) vaccine. When indicated, a person who is uncertain of his immunization history and has no record of immunization should receive the PCV13 vaccine. All adults 17 years of age and older should receive this vaccine. An adult aged 14 years or older who has certain medical conditions and has not been previously immunized should receive 1 dose of PCV13 vaccine. This PCV13 should be followed with a dose of pneumococcal polysaccharide (PPSV23) vaccine. Adults who are at high risk for pneumococcal disease should obtain the PPSV23 vaccine at least 8 weeks after the dose of PCV13 vaccine. Adults older than 50 years of age who have normal immune system function should obtain the PPSV23 vaccine dose at least 1 year after the dose of PCV13 vaccine.  Pneumococcal polysaccharide (PPSV23) vaccine. When PCV13 is also indicated, PCV13 should be obtained first. All adults aged 26 years and older should be immunized. An adult younger than age 46 years who has certain medical conditions should be immunized. Any person who resides in a nursing home or long-term care facility should be immunized. An adult smoker should be immunized. People with an immunocompromised condition and certain other conditions should receive both PCV13 and PPSV23 vaccines. People with human immunodeficiency virus (HIV) infection should be immunized as soon as possible after diagnosis. Immunization during chemotherapy or radiation therapy should be avoided. Routine use of PPSV23 vaccine is not recommended for American Indians, [place] Natives, or people younger than 65 years unless there are medical conditions that require PPSV23 vaccine. When indicated, people who have unknown immunization and have no record of immunization should receive PPSV23  vaccine. One-time revaccination 5 years after the first dose of PPSV23 is recommended for people aged 19-64 years who have chronic kidney failure, nephrotic syndrome, asplenia, or immunocompromised conditions. People who received 1-2 doses of PPSV23 before age 76 years should receive another dose of PPSV23 vaccine at age 33 years or later if at least 5 years have passed since the previous dose.  Doses of PPSV23 are not needed for people immunized with PPSV23 at or after age 48 years.  Meningococcal vaccine. Adults with asplenia or persistent complement component deficiencies should receive 2 doses of quadrivalent meningococcal conjugate (MenACWY-D) vaccine. The doses should be obtained at least 2 months apart. Microbiologists working with certain meningococcal bacteria, [place] recruits, people at risk during an outbreak, and people who travel to or live in countries with a high rate of meningitis should be immunized. A first-year college student up through age 63 years who is living in a residence hall should receive a dose if he did not receive a dose on or after his 16th birthday. Adults who have certain high-risk conditions should receive one or more doses of vaccine.  Hepatitis A vaccine. Adults who wish to be protected from this disease, have chronic liver disease, work with hepatitis A-infected animals, work in hepatitis A research labs, or travel to or work in countries with a high rate of hepatitis A should be immunized. Adults who were previously unvaccinated and who anticipate close contact with an international adoptee during the first 60 days after arrival in the [place] from a country with a high rate of hepatitis A should be immunized.  Hepatitis B vaccine. Adults should be immunized if they wish to be protected from this disease, are under age 41 years and have diabetes, have chronic liver disease, have had more than one sex partner in the past 6 months, may be exposed to blood or other  infectious body fluids, are household contacts or sex partners of hepatitis B positive people, are clients or workers in certain care facilities, or travel to or work in countries with a high rate of hepatitis B.  Haemophilus influenzae type b (Hib) vaccine. A previously unvaccinated person with asplenia or sickle cell disease or having a scheduled splenectomy should receive 1 dose of Hib vaccine. Regardless of previous immunization, a recipient of a hematopoietic stem cell transplant should receive a 3-dose series 6-12 months after his successful transplant. Hib vaccine is not recommended for adults with HIV infection. Preventive Service / Frequency Ages 66 to 20  Blood pressure check.** / Every 3-5 years.  Lipid and cholesterol check.** / Every 5 years beginning at age 63.  Hepatitis C blood test.** / For any individual with known risks for hepatitis C.  Skin self-exam. / Monthly.  Influenza vaccine. / Every year.  Tetanus, diphtheria, and acellular pertussis (Tdap, Td) vaccine.** / Consult your health care provider. 1 dose of Td every 10 years.  Varicella vaccine.** / Consult your health care provider.  HPV vaccine. / 3 doses over 6 months, if 48 or younger.  Measles, mumps, rubella (MMR) vaccine.** / You need at least 1 dose of MMR if you were born in 1957 or later. You may also need a second dose.  Pneumococcal 13-valent conjugate (PCV13) vaccine.** / Consult your health care provider.  Pneumococcal polysaccharide (PPSV23) vaccine.** / 1 to 2 doses if you smoke cigarettes or if you have certain conditions.  Meningococcal vaccine.** / 1 dose if you are age 65 to 102 years and a [employer] researcher living in a residence hall, or have one of several medical conditions. You may also need additional booster doses.  Hepatitis A vaccine.** / Consult your health care provider.  Hepatitis B vaccine.** / Consult your health care provider.  Haemophilus influenzae type b (Hib)  vaccine.** / Consult your health care provider. Ages 7 to 49  Blood pressure check.** / Every  year.  Lipid and cholesterol check.** / Every 5 years beginning at age 33.  Lung cancer screening. / Every year if you are aged 59-80 years and have a 30-pack-year history of smoking and currently smoke or have quit within the past 15 years. Yearly screening is stopped once you have quit smoking for at least 15 years or develop a health problem that would prevent you from having lung cancer treatment.  Fecal occult blood test (FOBT) of stool. / Every year beginning at age 80 and continuing until age 52. You may not have to do this test if you get a colonoscopy every 10 years.  Flexible sigmoidoscopy** or colonoscopy.** / Every 5 years for a flexible sigmoidoscopy or every 10 years for a colonoscopy beginning at age 75 and continuing until age 16.  Hepatitis C blood test.** / For all people born from [birth year] through 1965 and any individual with known risks for hepatitis C.  Skin self-exam. / Monthly.  Influenza vaccine. / Every year.  Tetanus, diphtheria, and acellular pertussis (Tdap/Td) vaccine.** / Consult your health care provider. 1 dose of Td every 10 years.  Varicella vaccine.** / Consult your health care provider.  Zoster vaccine.** / 1 dose for adults aged 30 years or older.  Measles, mumps, rubella (MMR) vaccine.** / You need at least 1 dose of MMR if you were born in 1957 or later. You may also need a second dose.  Pneumococcal 13-valent conjugate (PCV13) vaccine.** / Consult your health care provider.  Pneumococcal polysaccharide (PPSV23) vaccine.** / 1 to 2 doses if you smoke cigarettes or if you have certain conditions.  Meningococcal vaccine.** / Consult your health care provider.  Hepatitis A vaccine.** / Consult your health care provider.  Hepatitis B vaccine.** / Consult your health care provider.  Haemophilus influenzae type b (Hib) vaccine.** / Consult your health care  provider. Ages 66 and over  Blood pressure check.** / Every year.  Lipid and cholesterol check.**/ Every 5 years beginning at age 80.  Lung cancer screening. / Every year if you are aged 56-80 years and have a 30-pack-year history of smoking and currently smoke or have quit within the past 15 years. Yearly screening is stopped once you have quit smoking for at least 15 years or develop a health problem that would prevent you from having lung cancer treatment.  Fecal occult blood test (FOBT) of stool. / Every year beginning at age 6 and continuing until age 37. You may not have to do this test if you get a colonoscopy every 10 years.  Flexible sigmoidoscopy** or colonoscopy.** / Every 5 years for a flexible sigmoidoscopy or every 10 years for a colonoscopy beginning at age 79 and continuing until age 39.  Hepatitis C blood test.** / For all people born from [birth year] through 1965 and any individual with known risks for hepatitis C.  Abdominal aortic aneurysm (AAA) screening.** / A one-time screening for ages 50 to 47 years who are current or former smokers.  Skin self-exam. / Monthly.  Influenza vaccine. / Every year.  Tetanus, diphtheria, and acellular pertussis (Tdap/Td) vaccine.** / 1 dose of Td every 10 years.  Varicella vaccine.** / Consult your health care provider.  Zoster vaccine.** / 1 dose for adults aged 107 years or older.  Pneumococcal 13-valent conjugate (PCV13) vaccine.** / 1 dose for all adults aged 75 years and older.  Pneumococcal polysaccharide (PPSV23) vaccine.** / 1 dose for all adults aged 85 years and older.  Meningococcal vaccine.** / Consult your  health care provider.  Hepatitis A vaccine.** / Consult your health care provider.  Hepatitis B vaccine.** / Consult your health care provider.  Haemophilus influenzae type b (Hib) vaccine.** / Consult your health care provider. **Family history and personal history of risk and conditions may change your health care  provider's recommendations.   This information is not intended to replace advice given to you by your health care provider. Make sure you discuss any questions you have with your health care provider.   Document Released: 11/27/2001 Document Revised: 10/22/2014 Document Reviewed: 02/26/2011 Elsevier Interactive Patient Education [employer].

## 2015-12-15 NOTE — Progress Notes (Signed)
Pre visit review using our clinic review tool, if applicable. No additional management support is needed unless otherwise documented below in the visit note. 

## 2015-12-15 NOTE — Assessment & Plan Note (Addendum)
Cbc, cmp, tsh, lipid panel and ua today. Hiv screening as well.

## 2015-12-15 NOTE — Progress Notes (Signed)
Subjective:    Patient ID: Brian Rosales, male    DOB: 31-Mar-1966, 50 y.o.   MRN: [id number]  HPI   I have reviewed pt PMH, PSH, FH, Social History and Surgical History.  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 He does Yoga. Pt drinks caffeine about 3 cups a in am, no alcohol, he states healthy diet.  Pt state his psychiatrist wanted him to come in for blood pressure check. Pt states severe anxiety history. Pt is on Wellbutrin. Pt is on clonazepam. Pt is looking for work. Stressed out. Pt has been doing graphic deceasing. Pt thinks bp elevation are always associated with psychiatrist office visits.    Review of Systems  Constitutional: Negative for chills and fatigue.  Respiratory: Negative for cough, choking and shortness of breath.   Cardiovascular: Negative for chest pain and palpitations.       Sometimes at night when anxious and trying to sleep can feel his heart pound.  Skin: Negative for rash.  Neurological: Negative for dizziness, speech difficulty, weakness and headaches.  Psychiatric/Behavioral: Negative for suicidal ideas, confusion, dysphoric mood, decreased concentration and agitation. The patient is nervous/anxious.      Past Medical History  Diagnosis Date  . Anxiety   . Osgood-Schlatter's disease     By history and description. Probable.  . Depression   . Hyperlipidemia   . Kidney stones   . Allergy     Social History   Social History  . Marital Status: Single    Spouse Name: N/A  . Number of Children: N/A  . Years of Education: N/A   Occupational History  . Not on file.   Social History Main Topics  . Smoking status: Never Smoker   . Smokeless tobacco: Never Used  . Alcohol Use: 2.0 oz/week    4 drink(s) per week  . Drug Use: No  . Sexual Activity: No   Other Topics Concern  . Not on file   Social History Narrative    History reviewed. No pertinent past surgical history.  Family History  Problem Relation Age of Onset  . Glaucoma Mother     . Basal cell carcinoma Mother   . Hyperlipidemia Father   . Prostate cancer Father   . Melanoma Paternal Grandmother     No Known Allergies  Current Outpatient Prescriptions on File Prior to Visit  Medication Sig Dispense Refill  . fluticasone (FLONASE) 50 MCG/ACT nasal spray Place into both nostrils daily.    Marland Kitchen loratadine (CLARITIN) 10 MG tablet Take 10 mg by mouth daily.    . simvastatin (ZOCOR) 20 MG tablet Take 1 tablet (20 mg total) by mouth daily. (Patient not taking: Reported on 12/15/2015) 90 tablet 3   No current facility-administered medications on file prior to visit.    BP 120/86 mmHg  Pulse 78  Temp(Src) 98.1 F (36.7 C) (Oral)  Ht 5' 9.5" (1.765 m)  Wt 182 lb 6.4 oz (82.736 kg)  BMI 26.56 kg/m2  SpO2 98%       Objective:   Physical Exam  General Mental Status- Alert. General Appearance- Not in acute distress.   Skin General: Color- Normal Color. Moisture- Normal Moisture.  Neck Carotid Arteries- Normal color. Moisture- Normal Moisture. No carotid bruits. No JVD.  Chest and Lung Exam Auscultation: Breath Sounds:-Normal.  Cardiovascular Auscultation:Rythm- Regular. Murmurs & Other Heart Sounds:Auscultation of the heart reveals- No Murmurs.  Abdomen Inspection:-Inspeection Normal. Palpation/Percussion:Note:No mass. Palpation and Percussion of the abdomen reveal- Non Tender, Non Distended + BS,  no rebound or guarding.   Neurologic Cranial Nerve exam:- CN III-XII intact(No nystagmus), symmetric smile. Drift Test:- No drift. Romberg Exam:- Negative.  Heal to Toe Gait exam:-Normal. Finger to Nose:- Normal/Intact Strength:- 5/5 equal and symmetric strength both upper and lower extremities.   Male Genitourinary Urethra:- No discharge. Penis- appears  uncircumcised. Scrotum- No masses. Testes- Bilateral-Normal.  NO hernia in inguinal canals.      .      Assessment & Plan:  Pt will be back in 2 wks will get MA to call pt and offer tdap and  flu vaccine.

## 2015-12-29 ENCOUNTER — Encounter: Payer: Self-pay | Admitting: Medical

## 2015-12-29 ENCOUNTER — Ambulatory Visit (INDEPENDENT_AMBULATORY_CARE_PROVIDER_SITE_OTHER): Payer: BLUE CROSS/BLUE SHIELD | Admitting: Medical

## 2015-12-29 VITALS — BP 120/80 | HR 71 | Temp 98.1°F | Ht 69.5 in | Wt 181.4 lb

## 2015-12-29 DIAGNOSIS — R030 Elevated blood-pressure reading, without diagnosis of hypertension: Secondary | ICD-10-CM

## 2015-12-29 DIAGNOSIS — Z23 Encounter for immunization: Secondary | ICD-10-CM | POA: Diagnosis not present

## 2015-12-29 DIAGNOSIS — R002 Palpitations: Secondary | ICD-10-CM | POA: Diagnosis not present

## 2015-12-29 DIAGNOSIS — F4323 Adjustment disorder with mixed anxiety and depressed mood: Secondary | ICD-10-CM | POA: Diagnosis not present

## 2015-12-29 NOTE — Patient Instructions (Addendum)
Your blood pressure is well controlled great majority of the time including today. You don't need bp medication based on these readings.  We did get baseline ekg today. Any tachycardia or palpitation sensation likely anxiety related as you think.  Continue your care with psychiatrist for anxiety.  Follow up in October or as needed

## 2015-12-29 NOTE — Progress Notes (Signed)
Pre visit review using our clinic review tool, if applicable. No additional management support is needed unless otherwise documented below in the visit note. 

## 2015-12-29 NOTE — Progress Notes (Signed)
Subjective:    Patient ID: Brian Rosales, male    DOB: 1965-12-12, 50 y.o.   MRN: [id number]  HPI   Pt in for follow up up. He had mild elevated high bp at psychiatrist office.  He has about 25 readings. One was 143/91 after group therapy. Another mild high reading 140/80. But others all less than 140/90. Good portion of time 120/80.  Pulse looks evenly distributed 60-80 range.  Pt mood and anxiety controlled. Pt seeing psychiatrist. Last night he slept well. Pt took xanax. Last night. Pt states psychiatrist wrote both his clonopin and xanax. But they discourage overuse of xanax.   Pt will get tdap today. He defers flu vaccine.  Occasional reports tachycardia with stress/anxiety. But not now.    Review of Systems  Constitutional: Negative for fever, chills, diaphoresis, activity change and fatigue.  Respiratory: Negative for cough, chest tightness and shortness of breath.   Cardiovascular: Negative for chest pain, palpitations and leg swelling.  Gastrointestinal: Negative for nausea, vomiting and abdominal pain.  Musculoskeletal: Negative for neck pain and neck stiffness.  Neurological: Negative for dizziness, seizures, weakness, light-headedness and headaches.  Psychiatric/Behavioral: Negative for behavioral problems, confusion and agitation. The patient is not nervous/anxious.     Past Medical History  Diagnosis Date  . Anxiety   . Osgood-Schlatter's disease     By history and description. Probable.  . Depression   . Hyperlipidemia   . Kidney stones   . Allergy     Social History   Social History  . Marital Status: Single    Spouse Name: N/A  . Number of Children: N/A  . Years of Education: N/A   Occupational History  . Not on file.   Social History Main Topics  . Smoking status: Never Smoker   . Smokeless tobacco: Never Used  . Alcohol Use: 2.0 oz/week    4 drink(s) per week  . Drug Use: No  . Sexual Activity: No   Other Topics Concern  . Not on file    Social History Narrative    No past surgical history on file.  Family History  Problem Relation Age of Onset  . Glaucoma Mother   . Basal cell carcinoma Mother   . Hyperlipidemia Father   . Prostate cancer Father   . Melanoma Paternal Grandmother     No Known Allergies  Current Outpatient Prescriptions on File Prior to Visit  Medication Sig Dispense Refill  . buPROPion (WELLBUTRIN XL) 150 MG 24 hr tablet     . clonazePAM (KLONOPIN) 0.5 MG tablet     . fluticasone (FLONASE) 50 MCG/ACT nasal spray Place into both nostrils daily.    Marland Kitchen loratadine (CLARITIN) 10 MG tablet Take 10 mg by mouth daily.    . simvastatin (ZOCOR) 20 MG tablet Take 1 tablet (20 mg total) by mouth daily. 90 tablet 0   No current facility-administered medications on file prior to visit.    BP 120/80 mmHg  Pulse 71  Temp(Src) 98.1 F (36.7 C) (Oral)  Ht 5' 9.5" (1.765 m)  Wt 181 lb 6.4 oz (82.283 kg)  BMI 26.41 kg/m2  SpO2 98%       Objective:   Physical Exam  General Mental Status- Alert. General Appearance- Not in acute distress.    Chest and Lung Exam Auscultation: Breath Sounds:-Normal.  Cardiovascular Auscultation:Rythm- Regular. Murmurs & Other Heart Sounds:Auscultation of the heart reveals- No Murmurs.   Neurologic Cranial Nerve exam:- CN III-XII intact(No nystagmus), symmetric smile. Strength:-  5/5 equal and symmetric strength both upper and lower extremities.     Assessment & Plan:  Ekg appears nsr.  Your blood pressure is well controlled great majority of the time including today. You don't need bp medication based on these readings.  We did get baseline ekg today. Any tachycardia or palpitation sensation likely anxiety related as you think.  Continue your care with psychiatrist for anxiety.  Follow up in October or as needed

## 2016-02-06 DIAGNOSIS — F329 Major depressive disorder, single episode, unspecified: Secondary | ICD-10-CM | POA: Diagnosis not present

## 2016-02-06 DIAGNOSIS — F411 Generalized anxiety disorder: Secondary | ICD-10-CM | POA: Diagnosis not present

## 2016-02-20 DIAGNOSIS — F4323 Adjustment disorder with mixed anxiety and depressed mood: Secondary | ICD-10-CM | POA: Diagnosis not present

## 2016-02-27 DIAGNOSIS — F4323 Adjustment disorder with mixed anxiety and depressed mood: Secondary | ICD-10-CM | POA: Diagnosis not present

## 2016-03-05 DIAGNOSIS — F4323 Adjustment disorder with mixed anxiety and depressed mood: Secondary | ICD-10-CM | POA: Diagnosis not present

## 2016-03-13 DIAGNOSIS — F4323 Adjustment disorder with mixed anxiety and depressed mood: Secondary | ICD-10-CM | POA: Diagnosis not present

## 2016-03-20 DIAGNOSIS — F4323 Adjustment disorder with mixed anxiety and depressed mood: Secondary | ICD-10-CM | POA: Diagnosis not present

## 2016-03-22 DIAGNOSIS — F411 Generalized anxiety disorder: Secondary | ICD-10-CM | POA: Diagnosis not present

## 2016-03-22 DIAGNOSIS — F329 Major depressive disorder, single episode, unspecified: Secondary | ICD-10-CM | POA: Diagnosis not present

## 2016-03-29 DIAGNOSIS — F4323 Adjustment disorder with mixed anxiety and depressed mood: Secondary | ICD-10-CM | POA: Diagnosis not present

## 2016-04-04 DIAGNOSIS — F4323 Adjustment disorder with mixed anxiety and depressed mood: Secondary | ICD-10-CM | POA: Diagnosis not present

## 2016-04-11 DIAGNOSIS — F4323 Adjustment disorder with mixed anxiety and depressed mood: Secondary | ICD-10-CM | POA: Diagnosis not present

## 2016-05-02 DIAGNOSIS — F4323 Adjustment disorder with mixed anxiety and depressed mood: Secondary | ICD-10-CM | POA: Diagnosis not present

## 2016-05-08 DIAGNOSIS — F4323 Adjustment disorder with mixed anxiety and depressed mood: Secondary | ICD-10-CM | POA: Diagnosis not present

## 2016-05-17 DIAGNOSIS — F4323 Adjustment disorder with mixed anxiety and depressed mood: Secondary | ICD-10-CM | POA: Diagnosis not present

## 2016-05-30 DIAGNOSIS — F4323 Adjustment disorder with mixed anxiety and depressed mood: Secondary | ICD-10-CM | POA: Diagnosis not present

## 2016-06-06 DIAGNOSIS — F4323 Adjustment disorder with mixed anxiety and depressed mood: Secondary | ICD-10-CM | POA: Diagnosis not present

## 2016-06-13 DIAGNOSIS — F4323 Adjustment disorder with mixed anxiety and depressed mood: Secondary | ICD-10-CM | POA: Diagnosis not present

## 2016-06-20 DIAGNOSIS — F4323 Adjustment disorder with mixed anxiety and depressed mood: Secondary | ICD-10-CM | POA: Diagnosis not present

## 2016-06-27 DIAGNOSIS — F4323 Adjustment disorder with mixed anxiety and depressed mood: Secondary | ICD-10-CM | POA: Diagnosis not present

## 2016-07-04 DIAGNOSIS — F4323 Adjustment disorder with mixed anxiety and depressed mood: Secondary | ICD-10-CM | POA: Diagnosis not present

## 2016-07-11 DIAGNOSIS — F4323 Adjustment disorder with mixed anxiety and depressed mood: Secondary | ICD-10-CM | POA: Diagnosis not present

## 2016-07-17 ENCOUNTER — Ambulatory Visit: Payer: BLUE CROSS/BLUE SHIELD | Admitting: Medical

## 2016-07-17 DIAGNOSIS — F329 Major depressive disorder, single episode, unspecified: Secondary | ICD-10-CM | POA: Diagnosis not present

## 2016-07-17 DIAGNOSIS — F411 Generalized anxiety disorder: Secondary | ICD-10-CM | POA: Diagnosis not present

## 2016-07-18 DIAGNOSIS — F4323 Adjustment disorder with mixed anxiety and depressed mood: Secondary | ICD-10-CM | POA: Diagnosis not present

## 2016-07-25 DIAGNOSIS — F4323 Adjustment disorder with mixed anxiety and depressed mood: Secondary | ICD-10-CM | POA: Diagnosis not present

## 2016-08-08 DIAGNOSIS — F4323 Adjustment disorder with mixed anxiety and depressed mood: Secondary | ICD-10-CM | POA: Diagnosis not present

## 2016-08-15 DIAGNOSIS — F4323 Adjustment disorder with mixed anxiety and depressed mood: Secondary | ICD-10-CM | POA: Diagnosis not present

## 2016-08-29 DIAGNOSIS — F4323 Adjustment disorder with mixed anxiety and depressed mood: Secondary | ICD-10-CM | POA: Diagnosis not present

## 2016-09-19 DIAGNOSIS — F4323 Adjustment disorder with mixed anxiety and depressed mood: Secondary | ICD-10-CM | POA: Diagnosis not present

## 2016-09-24 DIAGNOSIS — F329 Major depressive disorder, single episode, unspecified: Secondary | ICD-10-CM | POA: Diagnosis not present

## 2016-09-24 DIAGNOSIS — F411 Generalized anxiety disorder: Secondary | ICD-10-CM | POA: Diagnosis not present

## 2016-11-26 DIAGNOSIS — J188 Other pneumonia, unspecified organism: Secondary | ICD-10-CM | POA: Diagnosis not present

## 2017-03-12 DIAGNOSIS — D225 Melanocytic nevi of trunk: Secondary | ICD-10-CM | POA: Diagnosis not present

## 2017-03-12 DIAGNOSIS — Z08 Encounter for follow-up examination after completed treatment for malignant neoplasm: Secondary | ICD-10-CM | POA: Diagnosis not present

## 2017-03-12 DIAGNOSIS — L570 Actinic keratosis: Secondary | ICD-10-CM | POA: Diagnosis not present

## 2017-03-12 DIAGNOSIS — D485 Neoplasm of uncertain behavior of skin: Secondary | ICD-10-CM | POA: Diagnosis not present

## 2017-03-12 DIAGNOSIS — D2361 Other benign neoplasm of skin of right upper limb, including shoulder: Secondary | ICD-10-CM | POA: Diagnosis not present

## 2017-03-12 DIAGNOSIS — L814 Other melanin hyperpigmentation: Secondary | ICD-10-CM | POA: Diagnosis not present

## 2017-03-12 DIAGNOSIS — Z85828 Personal history of other malignant neoplasm of skin: Secondary | ICD-10-CM | POA: Diagnosis not present

## 2017-03-18 ENCOUNTER — Encounter: Payer: Self-pay | Admitting: Medical

## 2017-03-18 ENCOUNTER — Ambulatory Visit (INDEPENDENT_AMBULATORY_CARE_PROVIDER_SITE_OTHER): Payer: BLUE CROSS/BLUE SHIELD | Admitting: Medical

## 2017-03-18 ENCOUNTER — Telehealth: Payer: Self-pay | Admitting: Medical

## 2017-03-18 VITALS — BP 138/92 | HR 77 | Temp 98.2°F | Resp 16 | Ht 69.0 in | Wt 177.8 lb

## 2017-03-18 DIAGNOSIS — R350 Frequency of micturition: Secondary | ICD-10-CM

## 2017-03-18 DIAGNOSIS — R102 Pelvic and perineal pain: Secondary | ICD-10-CM

## 2017-03-18 LAB — POC URINALSYSI DIPSTICK (AUTOMATED)
BILIRUBIN UA: NEGATIVE
Blood, UA: NEGATIVE
GLUCOSE UA: NEGATIVE
KETONES UA: NEGATIVE
LEUKOCYTES UA: NEGATIVE
Nitrite, UA: NEGATIVE
Protein, UA: NEGATIVE
Spec Grav, UA: 1.015 (ref 1.010–1.025)
Urobilinogen, UA: 0.2 E.U./dL
pH, UA: 6 (ref 5.0–8.0)

## 2017-03-18 LAB — PSA: PSA: 0.84 ng/mL (ref 0.10–4.00)

## 2017-03-18 MED ORDER — CIPROFLOXACIN HCL 500 MG PO TABS: 500.0000 mg | ORAL_TABLET | Freq: Two times a day (BID) | ORAL | 0 refills | Status: AC

## 2017-03-18 NOTE — Patient Instructions (Addendum)
For your frequent urination and pain over bladder will get urine sample and will get culture of your urine. Also will get psa today.  I will go ahead and rx cipro for possible prostatitis. Will follow you lab results and our office will let you know the results. You could also use low dose ibuprofen for pain.  You may need further work up if your symptoms worsen or change. Maybe imaging studies.   If symptoms linger might consider urologist referral.   Follow up in 3 weeks or as needed

## 2017-03-18 NOTE — Progress Notes (Signed)
Subjective:    Patient ID: Brian Rosales, male    DOB: 26-Jan-1966, 51 y.o.   MRN: [id number]  HPI  Pt in states he had some mild lower abdomen pain since Wednesday. He states felt like he had full bladder sensation/mild achiness. Saturday pain was at it peak. No fever, no chills or sweats.  No hx of prostatitis. No perineum. Maybe some faint low back pain. Pt states hx of kidney stones in past but that pain was worse and 12 years ago.   No diarrhea or constipation. No hx of diverticulitis. Pt states eating very bland diet recently.  Pt had no psa done in past. Pt dad did in early 70's may have had prostate cancer.      Review of Systems  Constitutional: Negative for chills and fatigue.  Respiratory: Negative for cough, choking, chest tightness, shortness of breath and wheezing.   Cardiovascular: Negative for chest pain and palpitations.  Gastrointestinal: Positive for abdominal pain.  Genitourinary: Positive for frequency and urgency. Negative for difficulty urinating, dysuria, hematuria, penile pain, penile swelling, scrotal swelling and testicular pain.       Full bladder sensation pain.  Musculoskeletal:       No cva area pain.   Skin: Negative for pallor and rash.  Neurological: Negative for dizziness and headaches.  Hematological: Negative for adenopathy. Does not bruise/bleed easily.  Psychiatric/Behavioral: Negative for behavioral problems and confusion.    Past Medical History:  Diagnosis Date  . Allergy   . Anxiety   . Depression   . Hyperlipidemia   . Kidney stones   . Osgood-Schlatter's disease    By history and description. Probable.     Social History   Social History  . Marital status: Single    Spouse name: N/A  . Number of children: N/A  . Years of education: N/A   Occupational History  . Not on file.   Social History Main Topics  . Smoking status: Never Smoker  . Smokeless tobacco: Never Used  . Alcohol use 2.0 oz/week    4 drink(s) per  week  . Drug use: No  . Sexual activity: No   Other Topics Concern  . Not on file   Social History Narrative  . No narrative on file    No past surgical history on file.  Family History  Problem Relation Age of Onset  . Glaucoma Mother   . Basal cell carcinoma Mother   . Hyperlipidemia Father   . Prostate cancer Father   . Melanoma Paternal Grandmother     No Known Allergies  Current Outpatient Prescriptions on File Prior to Visit  Medication Sig Dispense Refill  . buPROPion (WELLBUTRIN XL) 150 MG 24 hr tablet     . clonazePAM (KLONOPIN) 0.5 MG tablet     . fluticasone (FLONASE) 50 MCG/ACT nasal spray Place into both nostrils daily.    Marland Kitchen loratadine (CLARITIN) 10 MG tablet Take 10 mg by mouth daily.    . simvastatin (ZOCOR) 20 MG tablet Take 1 tablet (20 mg total) by mouth daily. (Patient not taking: Reported on 03/18/2017) 90 tablet 0   No current facility-administered medications on file prior to visit.     BP (!) 138/92 (BP Location: Left Arm, Patient Position: Sitting, Cuff Size: Normal)   Pulse 77   Temp 98.2 F (36.8 C) (Oral)   Resp 16   Ht 5\' 9"  (1.753 m)   Wt 177 lb 12.8 oz (80.6 kg)   SpO2 100%  BMI 26.26 kg/m       Objective:   Physical Exam  General Appearance- Not in acute distress.  HEENT Eyes- Scleraeral/Conjuntiva-bilat- Not Yellow. Mouth & Throat- Normal.  Chest and Lung Exam Auscultation: Breath sounds:-Normal. Adventitious sounds:- No Adventitious sounds.  Cardiovascular Auscultation:Rythm - Regular. Heart Sounds -Normal heart sounds.  Abdomen Inspection:-Inspection Normal.  Palpation/Perucssion: Palpation and Percussion of the abdomen reveal- faint direct suprapubic mild Tenderness, No Rebound tenderness, No rigidity(Guarding) and No Palpable abdominal masses.  Liver:-Normal.  Spleen:- Normal.    Back- no cva pain on palpation.      Assessment & Plan:  For your frequent urination and pain over bladder will get urine sample  and will get culture of your urine. Also will get psa today.  I will go ahead and rx cipro for possible prostatitis. Will follow you lab results and our office will let you know the results. You could also use low dose ibuprofen for pain.  You may need further work up if your symptoms worsen or change. Maybe imaging studies.   If symptoms linger might consider urologist referral.   Follow up in 3 weeks or as needed  Sherlie Boyum, Ramon DredgeEdward, VF CorporationPA-C

## 2017-03-18 NOTE — Telephone Encounter (Signed)
Patient Name: Brian Rosales DOB: 12/03/1965 Initial Comment Caller states he has sharp pain in lower abdomin, where bladder is, not as bad as it was Saturday, worse when bladder is full. Certain spot in it. He is wondering what to do. Nurse Assessment Nurse: Charna Elizabethrumbull, RN, Cathy Date/Time (Eastern Time): 03/18/2017 9:20:21 AM Confirm and document reason for call. If symptomatic, describe symptoms. ---Caller states he developed lower, centralized abdominal pain 5 days ago (pain rated as a 4/5 on the 1 to 10 scale). No fever. No injury in the past week. Alert and responsive. Does the patient have any new or worsening symptoms? ---Yes Will a triage be completed? ---Yes Related visit to physician within the last 2 weeks? ---No Does the PT have any chronic conditions? (i.e. diabetes, asthma, etc.) ---Yes List chronic conditions. ---Depression, Anxiety Is this a behavioral health or substance abuse call? ---No Guidelines Guideline Title Affirmed Question Affirmed Notes Abdominal Pain - Male [1] MILD-MODERATE pain AND [2] constant AND [3] present > 2 hours Final Disposition User See Physician within 4 Hours (or PCP triage) Charna Elizabethrumbull, RN, Cathy Comments Scheduled for 11:30am appointment with Dr. Alvira MondaySaguier. Referrals REFERRED TO PCP OFFICE Disagree/Comply: Comply

## 2017-03-19 LAB — URINE CULTURE: ORGANISM ID, BACTERIA: NO GROWTH

## 2017-03-26 DIAGNOSIS — F329 Major depressive disorder, single episode, unspecified: Secondary | ICD-10-CM | POA: Diagnosis not present

## 2017-03-26 DIAGNOSIS — F411 Generalized anxiety disorder: Secondary | ICD-10-CM | POA: Diagnosis not present

## 2017-04-10 ENCOUNTER — Encounter: Payer: BLUE CROSS/BLUE SHIELD | Admitting: Medical

## 2017-04-12 ENCOUNTER — Encounter: Payer: BLUE CROSS/BLUE SHIELD | Admitting: Medical

## 2017-07-02 DIAGNOSIS — F411 Generalized anxiety disorder: Secondary | ICD-10-CM | POA: Diagnosis not present

## 2017-07-02 DIAGNOSIS — F329 Major depressive disorder, single episode, unspecified: Secondary | ICD-10-CM | POA: Diagnosis not present

## 2017-10-22 DIAGNOSIS — F411 Generalized anxiety disorder: Secondary | ICD-10-CM | POA: Diagnosis not present

## 2017-10-22 DIAGNOSIS — F329 Major depressive disorder, single episode, unspecified: Secondary | ICD-10-CM | POA: Diagnosis not present

## 2017-12-23 DIAGNOSIS — Z8659 Personal history of other mental and behavioral disorders: Secondary | ICD-10-CM | POA: Diagnosis not present

## 2017-12-23 DIAGNOSIS — F329 Major depressive disorder, single episode, unspecified: Secondary | ICD-10-CM | POA: Diagnosis not present

## 2017-12-23 DIAGNOSIS — F411 Generalized anxiety disorder: Secondary | ICD-10-CM | POA: Diagnosis not present

## 2018-03-31 DIAGNOSIS — F411 Generalized anxiety disorder: Secondary | ICD-10-CM | POA: Diagnosis not present

## 2018-03-31 DIAGNOSIS — F329 Major depressive disorder, single episode, unspecified: Secondary | ICD-10-CM | POA: Diagnosis not present

## 2018-07-07 DIAGNOSIS — F329 Major depressive disorder, single episode, unspecified: Secondary | ICD-10-CM | POA: Diagnosis not present

## 2018-07-07 DIAGNOSIS — F411 Generalized anxiety disorder: Secondary | ICD-10-CM | POA: Diagnosis not present

## 2018-08-06 DIAGNOSIS — F329 Major depressive disorder, single episode, unspecified: Secondary | ICD-10-CM | POA: Diagnosis not present

## 2018-08-06 DIAGNOSIS — F411 Generalized anxiety disorder: Secondary | ICD-10-CM | POA: Diagnosis not present

## 2018-12-15 DIAGNOSIS — F411 Generalized anxiety disorder: Secondary | ICD-10-CM | POA: Diagnosis not present

## 2018-12-15 DIAGNOSIS — F329 Major depressive disorder, single episode, unspecified: Secondary | ICD-10-CM | POA: Diagnosis not present

## 2019-01-01 DIAGNOSIS — F329 Major depressive disorder, single episode, unspecified: Secondary | ICD-10-CM | POA: Diagnosis not present

## 2019-01-01 DIAGNOSIS — F411 Generalized anxiety disorder: Secondary | ICD-10-CM | POA: Diagnosis not present

## 2019-03-19 DIAGNOSIS — F411 Generalized anxiety disorder: Secondary | ICD-10-CM | POA: Diagnosis not present

## 2019-03-19 DIAGNOSIS — F410 Panic disorder [episodic paroxysmal anxiety] without agoraphobia: Secondary | ICD-10-CM | POA: Diagnosis not present

## 2019-03-31 DIAGNOSIS — Z1159 Encounter for screening for other viral diseases: Secondary | ICD-10-CM | POA: Diagnosis not present

## 2019-07-24 DIAGNOSIS — Z23 Encounter for immunization: Secondary | ICD-10-CM | POA: Diagnosis not present
